# Patient Record
Sex: Female | Born: 2005
Health system: Southern US, Community
[De-identification: ages and names within clinical notes are randomized; demographics above are authoritative.]

## PROBLEM LIST (undated history)

## (undated) DIAGNOSIS — J45909 Unspecified asthma, uncomplicated: Secondary | ICD-10-CM

---
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
## 2023-02-27 ENCOUNTER — Encounter (HOSPITAL_BASED_OUTPATIENT_CLINIC_OR_DEPARTMENT_OTHER): Payer: Self-pay | Admitting: Urology

## 2023-02-27 ENCOUNTER — Other Ambulatory Visit: Payer: Self-pay

## 2023-02-27 DIAGNOSIS — N939 Abnormal uterine and vaginal bleeding, unspecified: Secondary | ICD-10-CM | POA: Diagnosis present

## 2023-02-27 NOTE — ED Triage Notes (Signed)
Pt states on period and states passed clot of blood  Looked like large piece of tissue (pt has pic)  States continued cramping    Implant put in in November

## 2023-02-28 ENCOUNTER — Emergency Department (HOSPITAL_BASED_OUTPATIENT_CLINIC_OR_DEPARTMENT_OTHER)
Admission: EM | Admit: 2023-02-28 | Discharge: 2023-02-28 | Disposition: A | Discharge status: Home / Self Care | Payer: Medicaid Other | Attending: Emergency Medicine | Admitting: Emergency Medicine

## 2023-02-28 DIAGNOSIS — N939 Abnormal uterine and vaginal bleeding, unspecified: Secondary | ICD-10-CM

## 2023-02-28 LAB — URINALYSIS, ROUTINE W REFLEX MICROSCOPIC
Bilirubin Urine: NEGATIVE
Glucose, UA: NEGATIVE mg/dL
Ketones, ur: NEGATIVE mg/dL
Leukocytes,Ua: NEGATIVE
Nitrite: NEGATIVE
Protein, ur: NEGATIVE mg/dL
Specific Gravity, Urine: >=1.03 (ref 1.005–1.030)
pH: 6 (ref 5.0–8.0)

## 2023-02-28 LAB — WET PREP, GENITAL
Clue Cells Wet Prep HPF POC: NONE SEEN
Sperm: NONE SEEN
Trich, Wet Prep: NONE SEEN
WBC, Wet Prep HPF POC: <10 (ref <10)
Yeast Wet Prep HPF POC: NONE SEEN

## 2023-02-28 LAB — PREGNANCY, URINE: Preg Test, Ur: NEGATIVE

## 2023-02-28 LAB — URINALYSIS, MICROSCOPIC (REFLEX)

## 2023-02-28 NOTE — ED Provider Notes (Signed)
[place] EMERGENCY DEPARTMENT AT [facility] [place] Provider Note   CSN: [id number] Arrival date & time: 02/27/23  2039     History  Chief Complaint  Patient presents with   Vaginal Bleeding    Nancy Schmitt is a 17 y.o. female.  The history is provided by the patient.  Vaginal Bleeding Quality:  Dark red and clots Severity:  Moderate Onset quality:  Gradual Duration:  2 days Timing:  Constant Progression:  Unchanged Menstrual history:  Regular Possible pregnancy: no   Context: spontaneously   Relieved by:  Nothing Associated symptoms: no abdominal pain and no fever        Home Medications Prior to Admission medications   Medication Sig Start Date End Date Taking? Authorizing Provider  albuterol (VENTOLIN HFA) 108 (90 Base) MCG/ACT inhaler Inhale 2 puffs into the lungs every 4 (four) hours as needed for wheezing or shortness of breath. 01/19/22   Molpus, John, MD  Azelastine-Fluticasone (DYMISTA) 137-50 MCG/ACT SUSP Place 1 spray into the nose 2 (two) times daily. 11/27/22   Ferol Luz, MD  clobetasol ointment (TEMOVATE) 0.05 % Apply topically twice daily to BODY as needed for SEVERE red, sandpaper and thickened like rash.  Do not use on face, groin or armpits.  Use for up to two weeks at a time. 11/27/22   Ferol Luz, MD  cromolyn (OPTICROM) 4 % ophthalmic solution Place 1 drop into both eyes 4 (four) times daily. 11/27/22   Ferol Luz, MD  montelukast (SINGULAIR) 10 MG tablet Take 10 mg by mouth at bedtime.    [provider]  cetirizine (ZYRTEC ALLERGY) 10 MG tablet Take 1 tablet (10 mg total) by mouth daily. 06/20/17 01/25/20  Charlestine Night, PA-C      Allergies    Patient has no known allergies.    Review of Systems   Review of Systems  Constitutional:  Negative for fever.  HENT:  Negative for facial swelling.   Eyes:  Negative for redness.  Respiratory:  Negative for wheezing and stridor.   Cardiovascular:  Negative for chest  pain.  Gastrointestinal:  Negative for abdominal pain.  Genitourinary:  Positive for vaginal bleeding.  All other systems reviewed and are negative.   Physical Exam Updated Vital Signs BP 113/74   Pulse 65   Temp 98.1 F (36.7 C) (Oral)   Resp 14   Ht 5\' 3"  (1.6 m)   Wt 80.6 kg   SpO2 100%   BMI 31.48 kg/m  Physical Exam Vitals and nursing note reviewed.  Constitutional:      General: She is not in acute distress.    Appearance: Normal appearance. She is well-developed.  HENT:     Head: Normocephalic and atraumatic.     Nose: Nose normal.  Eyes:     Pupils: Pupils are equal, round, and reactive to light.  Cardiovascular:     Rate and Rhythm: Normal rate and regular rhythm.     Pulses: Normal pulses.     Heart sounds: Normal heart sounds.  Pulmonary:     Effort: Pulmonary effort is normal. No respiratory distress.     Breath sounds: Normal breath sounds.  Abdominal:     General: Bowel sounds are normal. There is no distension.     Palpations: Abdomen is soft.     Tenderness: There is no abdominal tenderness. There is no guarding or rebound.  Musculoskeletal:        General: Normal range of motion.     Cervical  back: Normal range of motion and neck supple.  Skin:    General: Skin is warm and dry.     Capillary Refill: Capillary refill takes less than 2 seconds.     Findings: No erythema or rash.  Neurological:     General: No focal deficit present.     Mental Status: She is alert and oriented to person, place, and time.     Deep Tendon Reflexes: Reflexes normal.  Psychiatric:        Mood and Affect: Mood normal.        Behavior: Behavior normal.     ED Results / Procedures / Treatments   Labs (all labs ordered are listed, but only abnormal results are displayed) Results for orders placed or performed during the hospital encounter of 02/28/23  Urinalysis, Routine w reflex microscopic -Urine, Clean Catch   Collection Time: 02/28/23 12:52 AM  Result Value Ref  Range   Color, Urine YELLOW YELLOW   APPearance CLEAR CLEAR   Specific Gravity, Urine >=1.030 1.005 - 1.030   pH 6.0 5.0 - 8.0   Glucose, UA NEGATIVE NEGATIVE mg/dL   Hgb urine dipstick LARGE (A) NEGATIVE   Bilirubin Urine NEGATIVE NEGATIVE   Ketones, ur NEGATIVE NEGATIVE mg/dL   Protein, ur NEGATIVE NEGATIVE mg/dL   Nitrite NEGATIVE NEGATIVE   Leukocytes,Ua NEGATIVE NEGATIVE  Pregnancy, urine   Collection Time: 02/28/23 12:52 AM  Result Value Ref Range   Preg Test, Ur NEGATIVE NEGATIVE  Urinalysis, Microscopic (reflex)   Collection Time: 02/28/23 12:52 AM  Result Value Ref Range   RBC / HPF 11-20 0 - 5 RBC/hpf   WBC, UA 0-5 0 - 5 WBC/hpf   Bacteria, UA RARE (A) NONE SEEN   Squamous Epithelial / HPF 0-5 0 - 5 /HPF   Mucus PRESENT   Wet prep, genital   Collection Time: 02/28/23  1:17 AM  Result Value Ref Range   Yeast Wet Prep HPF POC NONE SEEN NONE SEEN   Trich, Wet Prep NONE SEEN NONE SEEN   Clue Cells Wet Prep HPF POC NONE SEEN NONE SEEN   WBC, Wet Prep HPF POC <10 <10   Sperm NONE SEEN    No results found.  Radiology No results found.  Procedures Procedures    Medications Ordered in ED Medications - No data to display  ED Course/ Medical Decision Making/ A&P                                 Medical Decision Making Vaginal bleeding with clots   Amount and/or Complexity of Data Reviewed External Data Reviewed: notes.    Details: Previous notes reviewed  Labs: ordered.    Details: Wet prep is negative.  Urine is negative for UTI  Risk Risk Details: Well appearing.  Not hemorrhaging.  Follow up with you OB GYN for ongoing care.     Final Clinical Impression(s) / ED Diagnoses Final diagnoses:  Vaginal bleeding  Return for intractable cough, coughing up blood, fevers > 100.4 unrelieved by medication, shortness of breath, intractable vomiting, chest pain, shortness of breath, weakness, numbness, changes in speech, facial asymmetry, abdominal pain, passing  out, Inability to tolerate liquids or food, cough, altered mental status or any concerns. No signs of systemic illness or infection. The patient is nontoxic-appearing on exam and vital signs are within normal limits.  I have reviewed the triage vital signs and the nursing notes. Pertinent  labs & imaging results that were available during my care of the patient were reviewed by me and considered in my medical decision making (see chart for details). After history, exam, and medical workup I feel the patient has been appropriately medically screened and is safe for discharge home. Pertinent diagnoses were discussed with the patient. Patient was given return precautions.   Rx / DC Orders ED Discharge Orders     None         Sherrel Ploch, MD 02/28/23 0222

## 2023-02-28 NOTE — ED Notes (Signed)
Pt here for evaluation for vaginal bleeding, states she is on her period and has passed clots and ? Tissue Does c/o cramping

## 2023-03-01 LAB — GC/CHLAMYDIA PROBE AMP (~~LOC~~) NOT AT ARMC
Chlamydia: NEGATIVE
Comment: NEGATIVE
Comment: NORMAL
Neisseria Gonorrhea: NEGATIVE

## 2023-04-10 ENCOUNTER — Encounter (HOSPITAL_BASED_OUTPATIENT_CLINIC_OR_DEPARTMENT_OTHER): Payer: Self-pay | Admitting: Emergency Medicine

## 2023-04-10 ENCOUNTER — Emergency Department (HOSPITAL_BASED_OUTPATIENT_CLINIC_OR_DEPARTMENT_OTHER)
Admission: EM | Admit: 2023-04-10 | Discharge: 2023-04-10 | Disposition: A | Discharge status: Home / Self Care | Payer: Medicaid Other | Attending: Emergency Medicine | Admitting: Emergency Medicine

## 2023-04-10 ENCOUNTER — Other Ambulatory Visit: Payer: Self-pay

## 2023-04-10 DIAGNOSIS — R059 Cough, unspecified: Secondary | ICD-10-CM | POA: Diagnosis present

## 2023-04-10 DIAGNOSIS — J020 Streptococcal pharyngitis: Secondary | ICD-10-CM | POA: Insufficient documentation

## 2023-04-10 DIAGNOSIS — Z79899 Other long term (current) drug therapy: Secondary | ICD-10-CM | POA: Insufficient documentation

## 2023-04-10 DIAGNOSIS — J45909 Unspecified asthma, uncomplicated: Secondary | ICD-10-CM | POA: Insufficient documentation

## 2023-04-10 DIAGNOSIS — Z20822 Contact with and (suspected) exposure to covid-19: Secondary | ICD-10-CM | POA: Insufficient documentation

## 2023-04-10 DIAGNOSIS — J111 Influenza due to unidentified influenza virus with other respiratory manifestations: Secondary | ICD-10-CM | POA: Insufficient documentation

## 2023-04-10 DIAGNOSIS — Z7951 Long term (current) use of inhaled steroids: Secondary | ICD-10-CM | POA: Insufficient documentation

## 2023-04-10 LAB — RESP PANEL BY RT-PCR (RSV, FLU A&B, COVID)  RVPGX2
Influenza A by PCR: POSITIVE — AB
Influenza B by PCR: NEGATIVE
Resp Syncytial Virus by PCR: NEGATIVE
SARS Coronavirus 2 by RT PCR: NEGATIVE

## 2023-04-10 LAB — GROUP A STREP BY PCR: Group A Strep by PCR: DETECTED — AB

## 2023-04-10 MED ORDER — PENICILLIN V POTASSIUM 500 MG PO TABS: 500.0000 mg | ORAL_TABLET | Freq: Three times a day (TID) | ORAL | 0 refills | Status: AC

## 2023-04-10 MED ORDER — PENICILLIN V POTASSIUM 250 MG PO TABS
500.0000 mg | ORAL_TABLET | Freq: Once | ORAL | Status: AC
  Administered 2023-04-10: 500 mg via ORAL
  Filled 2023-04-10: qty 2

## 2023-04-10 NOTE — ED Triage Notes (Signed)
Pt c/o sore throat, cough, fever/chills

## 2023-04-10 NOTE — ED Provider Notes (Signed)
[place] EMERGENCY DEPARTMENT AT [facility] [place] Provider Note   CSN: [id number] Arrival date & time: 04/10/23  1535     History  Chief Complaint  Patient presents with   Sore Throat   Cough    Nancy Schmitt is a 18 y.o. female with past medical history of asthma presents to emergency department for evaluation of fever (Tmax 101 F), occasional productive cough, fatigue that started on Friday (4 days prior).  She also complains of a sore throat that started yesterday.  She denies chest pain, shortness of breath, increased use of inhaler, intractable vomiting, difficulty swallowing.   Sore Throat Pertinent negatives include no chest pain, no abdominal pain, no headaches and no shortness of breath.  Cough Associated symptoms: sore throat   Associated symptoms: no chest pain, no chills, no fever, no headaches, no shortness of breath and no wheezing      Home Medications Prior to Admission medications   Medication Sig Start Date End Date Taking? Authorizing Provider  penicillin v potassium (VEETID) 500 MG tablet Take 1 tablet (500 mg total) by mouth 3 (three) times daily for 10 days. 04/10/23 04/20/23 Yes Judithann Sheen, PA  albuterol (VENTOLIN HFA) 108 (90 Base) MCG/ACT inhaler Inhale 2 puffs into the lungs every 4 (four) hours as needed for wheezing or shortness of breath. 01/19/22   Molpus, John, MD  Azelastine-Fluticasone (DYMISTA) 137-50 MCG/ACT SUSP Place 1 spray into the nose 2 (two) times daily. 11/27/22   Ferol Luz, MD  clobetasol ointment (TEMOVATE) 0.05 % Apply topically twice daily to BODY as needed for SEVERE red, sandpaper and thickened like rash.  Do not use on face, groin or armpits.  Use for up to two weeks at a time. 11/27/22   Ferol Luz, MD  cromolyn (OPTICROM) 4 % ophthalmic solution Place 1 drop into both eyes 4 (four) times daily. 11/27/22   Ferol Luz, MD  montelukast (SINGULAIR) 10 MG tablet Take 10 mg by mouth at bedtime.    [provider]  cetirizine (ZYRTEC ALLERGY) 10 MG tablet Take 1 tablet (10 mg total) by mouth daily. 06/20/17 01/25/20  Charlestine Night, PA-C      Allergies    Patient has no known allergies.    Review of Systems   Review of Systems  Constitutional:  Positive for fatigue. Negative for chills and fever.  HENT:  Positive for sore throat.   Respiratory:  Positive for cough. Negative for chest tightness, shortness of breath and wheezing.   Cardiovascular:  Negative for chest pain and palpitations.  Gastrointestinal:  Negative for abdominal pain, constipation, diarrhea, nausea and vomiting.  Neurological:  Negative for dizziness, seizures, weakness, light-headedness, numbness and headaches.    Physical Exam Updated Vital Signs BP 125/79 (BP Location: Left Arm)   Pulse 81   Temp 98.1 F (36.7 C) (Oral)   Resp 17   Ht 5\' 3"  (1.6 m)   Wt 80.6 kg   SpO2 100%   BMI 31.48 kg/m  Physical Exam Vitals and nursing note reviewed.  Constitutional:      General: She is not in acute distress.    Appearance: Normal appearance. She is not ill-appearing.  HENT:     Head: Normocephalic and atraumatic.     Right Ear: Tympanic membrane, ear canal and external ear normal. No middle ear effusion. Tympanic membrane is not erythematous.     Left Ear: Tympanic membrane, ear canal and external ear normal.  No middle ear effusion. Tympanic membrane  is not erythematous.     Mouth/Throat:     Mouth: Mucous membranes are moist.     Pharynx: Uvula midline. Posterior oropharyngeal erythema present. No oropharyngeal exudate or uvula swelling.     Tonsils: No tonsillar exudate or tonsillar abscesses. 2+ on the right. 2+ on the left.     Comments: Uvula midline. No abscess, fluctuance, erythema noted in mouth Eyes:     General: No scleral icterus.       Right eye: No discharge.        Left eye: No discharge.     Conjunctiva/sclera: Conjunctivae normal.  Neck:     Comments: No meningismus Cardiovascular:      Rate and Rhythm: Normal rate.     Pulses: Normal pulses.  Pulmonary:     Effort: Pulmonary effort is normal. No respiratory distress.     Breath sounds: Normal breath sounds. No stridor. No wheezing or rhonchi.     Comments: Speaking in full and complete sentences without difficulty. Chest:     Chest wall: No tenderness.  Abdominal:     General: There is no distension.     Palpations: Abdomen is soft. There is no mass.     Tenderness: There is no abdominal tenderness. There is no guarding.  Musculoskeletal:     Cervical back: Normal range of motion and neck supple. No rigidity or tenderness.  Lymphadenopathy:     Cervical: No cervical adenopathy.  Skin:    General: Skin is warm.     Capillary Refill: Capillary refill takes less than 2 seconds.     Coloration: Skin is not jaundiced or pale.  Neurological:     Mental Status: She is alert and oriented to person, place, and time. Mental status is at baseline.    ED Results / Procedures / Treatments   Labs (all labs ordered are listed, but only abnormal results are displayed) Labs Reviewed  RESP PANEL BY RT-PCR (RSV, FLU A&B, COVID)  RVPGX2 - Abnormal; Notable for the following components:      Result Value   Influenza A by PCR POSITIVE (*)    All other components within normal limits  GROUP A STREP BY PCR - Abnormal; Notable for the following components:   Group A Strep by PCR DETECTED (*)    All other components within normal limits    EKG None  Radiology No results found.  Procedures Procedures    Medications Ordered in ED Medications  penicillin v potassium (VEETID) tablet 500 mg (500 mg Oral Given 04/10/23 1726)    ED Course/ Medical Decision Making/ A&P                                 Medical Decision Making Risk Prescription drug management.   Patient presents to the ED for concern of cough, sore throat, this involves an extensive number of treatment options, and is a complaint that carries with it a  high risk of complications and morbidity.  The differential diagnosis includes strep, COVID, flu, RSV, viral pharyngitis, laryngitis.  Less likely pneumonia, neoplasm, Ludwid's, PTA   Co morbidities that complicate the patient evaluation  asthma   Additional history obtained:  Additional history obtained from Nursing and Outside Medical Records   External records from outside source obtained and reviewed including  Triage RN note ED note from 01/25/2022 patient also had strep pharyngitis Was treated with penicillin with no adverse reactions, allergy  Lab Tests:  I Ordered, and personally interpreted labs.  The pertinent results include: Respiratory panel significant for influenza Strep positive    Medicines ordered and prescription drug management:  I ordered medication including penicillin for strep pharyngitis Reevaluation of the patient after these medicines showed that the patient improved I have reviewed the patients home medicines and have made adjustments as needed    Problem List / ED Course:  Influenza Outside of window for Tamiflu.  Discussed symptomatic treatment at home for virus to include intermittent Tylenol and ibuprofen. Discussed importance of oral hydration Strep pharyngitis No difficulty swallowing, intractable vomiting.  Patient is well-appearing.  Vital signs all within normal limits with no fever, tachycardia. Provided 1 dose of penicillin here in emergency department.  Sent prescription to patient's pharmacy.  She is aware that it has been sent and that she needs to pick up prescription. Discussed return to emergency department precautions with patient expresses understanding agrees with plan.  I also discussed that patient should follow-up with primary care provider if symptoms do not significantly improve within the next 7 days.    Reevaluation:  After the interventions noted above, I reevaluated the patient and found that they have  :improved   Social Determinants of Health:  Has pediatrician   Dispostion:  After consideration of the diagnostic results and the patients response to treatment, I feel that the patent would benefit from patient management with antibiotics for strep as well as symptomatic care for influenza.    Final Clinical Impression(s) / ED Diagnoses Final diagnoses:  Influenza  Strep pharyngitis    Rx / DC Orders ED Discharge Orders          Ordered    penicillin v potassium (VEETID) 500 MG tablet  3 times daily        04/10/23 1725              Judithann Sheen, PA 04/10/23 1752    Derwood Kaplan, MD 04/10/23 2130

## 2023-04-10 NOTE — Discharge Instructions (Signed)
Thank you for letting us evaluate you today. You tested positive for strep and flu. I have sent antibiotic to your pharmacy to start taking tomorrow morning as we gave you a dose here. Please make sure to take entire antibiotic course as directed even if feeling better. Change toothbrush in 2-3 days to avoid recontamination. No sharing drinks, oral fluids.  Make sure to keep fluids down.   Return to ED if worsening of symptoms, chest pain, shortness of breath, intractable vomiting

## 2023-04-11 ENCOUNTER — Telehealth: Payer: Self-pay | Admitting: Internal Medicine

## 2023-04-11 NOTE — Telephone Encounter (Signed)
Patient grandmother called and stated patient needs the medication albuterol albuterol (VENTOLIN HFA) 108 (90 Base) MCG/ACT inhaler and she mention another one but didn't know the name of it.

## 2023-04-12 MED ORDER — ALBUTEROL SULFATE HFA 108 (90 BASE) MCG/ACT IN AERS: 2.0000 | INHALATION_SPRAY | RESPIRATORY_TRACT | 0 refills | Status: DC | PRN

## 2023-04-12 MED ORDER — ALBUTEROL SULFATE (2.5 MG/3ML) 0.083% IN NEBU
2.5000 mg | INHALATION_SOLUTION | RESPIRATORY_TRACT | 1 refills | Status: AC | PRN
Start: 2023-04-12 — End: ?

## 2023-04-12 NOTE — Telephone Encounter (Signed)
Refilled ventolin hfa and albuterol 0.083% sent to [facility].

## 2023-05-09 ENCOUNTER — Other Ambulatory Visit: Payer: Self-pay | Admitting: Internal Medicine

## 2023-05-28 ENCOUNTER — Ambulatory Visit: Payer: Medicaid Other | Admitting: Internal Medicine

## 2023-11-21 ENCOUNTER — Other Ambulatory Visit: Payer: Self-pay

## 2023-11-21 ENCOUNTER — Encounter (HOSPITAL_BASED_OUTPATIENT_CLINIC_OR_DEPARTMENT_OTHER): Payer: Self-pay

## 2023-11-21 ENCOUNTER — Emergency Department (HOSPITAL_BASED_OUTPATIENT_CLINIC_OR_DEPARTMENT_OTHER)
Admission: EM | Admit: 2023-11-21 | Discharge: 2023-11-21 | Disposition: A | Discharge status: Home / Self Care | Attending: Emergency Medicine | Admitting: Emergency Medicine

## 2023-11-21 DIAGNOSIS — J029 Acute pharyngitis, unspecified: Secondary | ICD-10-CM | POA: Insufficient documentation

## 2023-11-21 DIAGNOSIS — R519 Headache, unspecified: Secondary | ICD-10-CM | POA: Diagnosis not present

## 2023-11-21 DIAGNOSIS — R0981 Nasal congestion: Secondary | ICD-10-CM | POA: Insufficient documentation

## 2023-11-21 LAB — RESP PANEL BY RT-PCR (RSV, FLU A&B, COVID)  RVPGX2
Influenza A by PCR: NEGATIVE
Influenza B by PCR: NEGATIVE
Resp Syncytial Virus by PCR: NEGATIVE
SARS Coronavirus 2 by RT PCR: NEGATIVE

## 2023-11-21 LAB — GROUP A STREP BY PCR: Group A Strep by PCR: NOT DETECTED

## 2023-11-21 MED ORDER — ACETAMINOPHEN 325 MG PO TABS
975.0000 mg | ORAL_TABLET | Freq: Once | ORAL | Status: AC
  Administered 2023-11-21: 975 mg via ORAL
  Filled 2023-11-21: qty 3

## 2023-11-21 NOTE — Discharge Instructions (Signed)
 Thank for letting us  evaluate you today.  You are COVID flu RSV negative.  You do not have strep pharyngitis.  Sore throat and other symptoms are likely secondary to virus.  Please continue send Tylenol , ibuprofen  intermittently every 8 hours as needed for pain.  You may also use humidifier for sore throat.  You may also pick up over-the-counter cough medicine, Chloraseptic spray for sore throat.  Use hot tea and honey for sore throat.  Return to Emergency Department if you experience inability to keep fluids down, significant worsening symptoms.  Otherwise, please establish care with a primary care provider for 2 medical complaints, annual visits

## 2023-11-21 NOTE — ED Triage Notes (Signed)
 Pt reports chills, congestion, headache x 3 days. Pt denies SOB, fever, N/V/D, weakness, fatigue. Mild sore throat reported. NAD noted in triage.

## 2023-11-21 NOTE — ED Provider Notes (Signed)
 [place] [facility] Provider Note   CSN: [id number] Arrival date & time: 11/21/23  1815     Patient presents with: Sore Throat and URI   Kadeshia Poynor is a 18 y.o. female with past medical history of asthma presents emergency department for evaluation of generalized headache, generalized weakness, sore throat, nasal congestion, chills that started yesterday.  Has been using Tylenol , ibuprofen  for pain without relief.  Has been able to keep p.o. fluids down.  Denies NVD, known sick contacts, blurry vision, chest pain, and shortness of breath    Sore Throat  URI Presenting symptoms: congestion        Prior to Admission medications   Medication Sig Start Date End Date Taking? Authorizing Provider  albuterol  (PROVENTIL ) (2.5 MG/3ML) 0.083% nebulizer solution Take 3 mLs (2.5 mg total) by nebulization every 4 (four) hours as needed for wheezing or shortness of breath. 04/12/23   Lorin Norris, MD  albuterol  (VENTOLIN  HFA) 108 (90 Base) MCG/ACT inhaler INHALE 2 PUFFS INTO THE LUNGS EVERY 4 HOURS AS NEEDED FOR WHEEZING OR SHORTNESS OF BREATH. 05/09/23   Lorin Norris, MD  Azelastine -Fluticasone  (DYMISTA ) 137-50 MCG/ACT SUSP Place 1 spray into the nose 2 (two) times daily. 11/27/22   Lorin Norris, MD  clobetasol  ointment (TEMOVATE ) 0.05 % Apply topically twice daily to BODY as needed for SEVERE red, sandpaper and thickened like rash.  Do not use on face, groin or armpits.  Use for up to two weeks at a time. 11/27/22   Lorin Norris, MD  cromolyn  (OPTICROM ) 4 % ophthalmic solution Place 1 drop into both eyes 4 (four) times daily. 11/27/22   Lorin Norris, MD  montelukast (SINGULAIR) 10 MG tablet Take 10 mg by mouth at bedtime.    [provider]  cetirizine  (ZYRTEC  ALLERGY ) 10 MG tablet Take 1 tablet (10 mg total) by mouth daily. 06/20/17 01/25/20  Lawyer, Lonni, PA-C    Allergies: Patient has no known allergies.    Review of  Systems  HENT:  Positive for congestion.     Updated Vital Signs BP (!) 126/108 (BP Location: Right Arm)   Pulse (!) 101   Temp 99.1 F (37.3 C) (Oral)   Resp 16   Ht 5' 2.5 (1.588 m)   Wt 81.6 kg   SpO2 100%   BMI 32.40 kg/m   Physical Exam Vitals and nursing note reviewed.  Constitutional:      General: She is not in acute distress.    Appearance: Normal appearance. She is not ill-appearing.  HENT:     Head: Normocephalic and atraumatic.     Right Ear: Hearing and external ear normal. There is impacted cerumen.     Left Ear: Hearing, tympanic membrane, ear canal and external ear normal.     Nose: Congestion present.     Mouth/Throat:     Mouth: Mucous membranes are moist.     Pharynx: No oropharyngeal exudate or posterior oropharyngeal erythema.     Comments: Uvula midline. No abscess, fluctuance, erythema noted in mouth.  No submandibular swelling or tenderness.  No mastoid tenderness or swelling.  No tragal tenderness or swelling.  Maintaining secretions without difficulty.  No drooling. Eyes:     General: No scleral icterus.       Right eye: No discharge.        Left eye: No discharge.     Conjunctiva/sclera: Conjunctivae normal.  Cardiovascular:     Rate and Rhythm: Normal rate.  Pulses: Normal pulses.  Pulmonary:     Effort: Pulmonary effort is normal. No respiratory distress.     Breath sounds: Normal breath sounds. No stridor. No wheezing or rhonchi.  Chest:     Chest wall: No tenderness.  Abdominal:     General: There is no distension.     Palpations: Abdomen is soft. There is no mass.     Tenderness: There is no abdominal tenderness. There is no guarding.  Musculoskeletal:     Cervical back: Normal range of motion and neck supple. No rigidity or tenderness.  Lymphadenopathy:     Cervical: No cervical adenopathy.  Skin:    General: Skin is warm.     Capillary Refill: Capillary refill takes less than 2 seconds.     Coloration: Skin is not jaundiced  or pale.  Neurological:     Mental Status: She is alert. Mental status is at baseline.     (all labs ordered are listed, but only abnormal results are displayed) Labs Reviewed  GROUP A STREP BY PCR  RESP PANEL BY RT-PCR (RSV, FLU A&B, COVID)  [id number]    EKG: None  Radiology: No results found.    Medications Ordered in the ED  acetaminophen  (TYLENOL ) tablet 975 mg (975 mg Oral Given 11/21/23 2029)                                    Medical Decision Making Risk OTC drugs.   Patient presents to the ED for concern of sore throat, generalized bodyaches, headache, this involves an extensive number of treatment options, and is a complaint that carries with it a high risk of complications and morbidity.  The differential diagnosis includes RSV, COVID, flu, infection, strep   Co morbidities that complicate the patient evaluation  None   Additional history obtained:  Additional history obtained from Nursing   External records from outside source obtained and reviewed including triage RN note   Lab Tests:  I Ordered, and personally interpreted labs.  The pertinent results include:   COVID, flu, RSV, strep negative    Medicines ordered and prescription drug management:  I ordered medication including Tylenol  for body aches Reevaluation of the patient after these medicines showed that the patient improved I have reviewed the patients home medicines and have made adjustments as needed    Problem List / ED Course:  Sore throat Strep negative No submandibular swelling or tenderness.  Low suspicion for emergent cause of symptoms to include Ludwig angina, tonsillar abscess, retropharyngeal abscess with reassuring HEENT exam Able to maintain secretions without difficulty nor drooling Able to keep fluids down and remain hydrated Likely secondary to viral pharyngitis Discussed symptomatic treatment at home to include [facility] fire, cough drops, Chloraseptic spray, hot tea,  honey Nasal congestion Respiratory panel negative Denies cough.  Lung sounds CTAB.  Low suspicion for pneumonia Non-intractable headache No history of migraines No thinners no recent head trauma no history of ICH No chest pain or blurry vision Headache has gradually been worsening since bodyaches, other infectious symptoms started.  Did not come on a maximum intensity.  Low suspicion for ICH/SAH BP 126/100.  Did discuss that she should establish care with primary care provider to check BP regularly to ensure that she does not have high blood pressure.  But that it also could be caused by feeling sick, headache Provided Tylenol  improving pain   Reevaluation:  After  the interventions noted above, I reevaluated the patient and found that they have :improved   Social Determinants of Health:  Former tobacco abuse Provided PCP for recommendation   Dispostion:  After consideration of the diagnostic results and the patients response to treatment, I feel that the patent would benefit from outpatient management with PCP establishment and symptomatic care.   Discussed ED workup, disposition, return to ED precautions with patient who expresses understanding agrees with plan.  All questions answered to their satisfaction.  They are agreeable to plan.  Discharge instructions provided on paperwork  Final diagnoses:  Sore throat  Nasal congestion  Acute nonintractable headache, unspecified headache type    ED Discharge Orders     None        Minnie Tinnie BRAVO, PA 11/21/23 [id number]    Lenor Hollering, MD 11/21/23 2318
